# Patient Record
Sex: Male | Born: 2000 | Race: Black or African American | Hispanic: No | Marital: Single | Smoking: Never smoker
Health system: Southern US, Community
[De-identification: ages and names within clinical notes are randomized; demographics above are authoritative.]

## PROBLEM LIST (undated history)

## (undated) DIAGNOSIS — E871 Hypo-osmolality and hyponatremia: Secondary | ICD-10-CM

## (undated) DIAGNOSIS — F988 Other specified behavioral and emotional disorders with onset usually occurring in childhood and adolescence: Secondary | ICD-10-CM

## (undated) HISTORY — PX: TYMPANOSTOMY TUBE PLACEMENT: SHX32

## (undated) HISTORY — PX: TONSILLECTOMY: SUR1361

---
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
## 2020-01-02 ENCOUNTER — Encounter (HOSPITAL_BASED_OUTPATIENT_CLINIC_OR_DEPARTMENT_OTHER): Payer: Self-pay | Admitting: Emergency Medicine

## 2020-01-02 ENCOUNTER — Emergency Department (HOSPITAL_BASED_OUTPATIENT_CLINIC_OR_DEPARTMENT_OTHER): Payer: Medicaid Other

## 2020-01-02 ENCOUNTER — Other Ambulatory Visit: Payer: Self-pay

## 2020-01-02 ENCOUNTER — Emergency Department (HOSPITAL_BASED_OUTPATIENT_CLINIC_OR_DEPARTMENT_OTHER)
Admission: EM | Admit: 2020-01-02 | Discharge: 2020-01-02 | Disposition: A | Discharge status: Home / Self Care | Payer: Medicaid Other | Attending: Emergency Medicine | Admitting: Emergency Medicine

## 2020-01-02 DIAGNOSIS — Y999 Unspecified external cause status: Secondary | ICD-10-CM | POA: Diagnosis not present

## 2020-01-02 DIAGNOSIS — M545 Low back pain, unspecified: Secondary | ICD-10-CM

## 2020-01-02 DIAGNOSIS — Z041 Encounter for examination and observation following transport accident: Secondary | ICD-10-CM | POA: Diagnosis present

## 2020-01-02 DIAGNOSIS — Y92481 Parking lot as the place of occurrence of the external cause: Secondary | ICD-10-CM | POA: Diagnosis not present

## 2020-01-02 DIAGNOSIS — Y9389 Activity, other specified: Secondary | ICD-10-CM | POA: Diagnosis not present

## 2020-01-02 MED ORDER — NAPROXEN 375 MG PO TABS: 375.0000 mg | ORAL_TABLET | Freq: Two times a day (BID) | ORAL | 0 refills | Status: AC

## 2020-01-02 MED ORDER — LIDOCAINE 5 % EX PTCH: 1.0000 | MEDICATED_PATCH | CUTANEOUS | 0 refills | Status: AC

## 2020-01-02 MED ORDER — METHOCARBAMOL 500 MG PO TABS: 500.0000 mg | ORAL_TABLET | Freq: Two times a day (BID) | ORAL | 0 refills | Status: AC
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
